# Patient Record
Sex: Male | Born: 1950 | Hispanic: Yes | Marital: Married | Smoking: Current some day smoker
Health system: Southern US, Community
[De-identification: ages and names within clinical notes are randomized; demographics above are authoritative.]

## PROBLEM LIST (undated history)

## (undated) DIAGNOSIS — I10 Essential (primary) hypertension: Secondary | ICD-10-CM

## (undated) DIAGNOSIS — E7800 Pure hypercholesterolemia, unspecified: Secondary | ICD-10-CM

## (undated) DIAGNOSIS — R7303 Prediabetes: Secondary | ICD-10-CM

---

## 2019-08-03 DIAGNOSIS — R9431 Abnormal electrocardiogram [ECG] [EKG]: Secondary | ICD-10-CM | POA: Insufficient documentation

## 2020-09-19 DIAGNOSIS — M19011 Primary osteoarthritis, right shoulder: Secondary | ICD-10-CM | POA: Insufficient documentation

## 2021-07-10 ENCOUNTER — Emergency Department: Payer: Medicare Other

## 2021-07-10 ENCOUNTER — Encounter: Payer: Self-pay | Admitting: Emergency Medicine

## 2021-07-10 ENCOUNTER — Other Ambulatory Visit: Payer: Self-pay

## 2021-07-10 ENCOUNTER — Emergency Department
Admission: EM | Admit: 2021-07-10 | Discharge: 2021-07-10 | Disposition: A | Discharge status: Home / Self Care | Payer: Medicare Other | Attending: Student in an Organized Health Care Education/Training Program | Admitting: Student in an Organized Health Care Education/Training Program

## 2021-07-10 ENCOUNTER — Ambulatory Visit
Admission: EM | Admit: 2021-07-10 | Discharge: 2021-07-10 | Disposition: A | Discharge status: Other Acute Inpatient Hospital | Payer: Medicare Other | Source: Home / Self Care

## 2021-07-10 DIAGNOSIS — Z79899 Other long term (current) drug therapy: Secondary | ICD-10-CM | POA: Insufficient documentation

## 2021-07-10 DIAGNOSIS — I10 Essential (primary) hypertension: Secondary | ICD-10-CM | POA: Insufficient documentation

## 2021-07-10 DIAGNOSIS — R42 Dizziness and giddiness: Secondary | ICD-10-CM | POA: Insufficient documentation

## 2021-07-10 DIAGNOSIS — E119 Type 2 diabetes mellitus without complications: Secondary | ICD-10-CM | POA: Insufficient documentation

## 2021-07-10 DIAGNOSIS — E782 Mixed hyperlipidemia: Secondary | ICD-10-CM | POA: Insufficient documentation

## 2021-07-10 DIAGNOSIS — H538 Other visual disturbances: Secondary | ICD-10-CM

## 2021-07-10 DIAGNOSIS — E1169 Type 2 diabetes mellitus with other specified complication: Secondary | ICD-10-CM | POA: Insufficient documentation

## 2021-07-10 DIAGNOSIS — E559 Vitamin D deficiency, unspecified: Secondary | ICD-10-CM

## 2021-07-10 DIAGNOSIS — Z0000 Encounter for general adult medical examination without abnormal findings: Secondary | ICD-10-CM | POA: Insufficient documentation

## 2021-07-10 HISTORY — DX: Prediabetes: R73.03

## 2021-07-10 HISTORY — DX: Pure hypercholesterolemia, unspecified: E78.00

## 2021-07-10 HISTORY — DX: Vitamin D deficiency, unspecified: E55.9

## 2021-07-10 HISTORY — DX: Essential (primary) hypertension: I10

## 2021-07-10 LAB — TROPONIN I (HIGH SENSITIVITY)
Troponin I (High Sensitivity): 5 ng/L (ref <18)
Troponin I (High Sensitivity): 5 ng/L (ref <18)

## 2021-07-10 LAB — COMPREHENSIVE METABOLIC PANEL
ALT: 29 U/L (ref 0–44)
AST: 27 U/L (ref 15–41)
Albumin: 4.3 g/dL (ref 3.5–5.0)
Alkaline Phosphatase: 60 U/L (ref 38–126)
Anion gap: 7 (ref 5–15)
BUN: 14 mg/dL (ref 8–23)
CO2: 26 mmol/L (ref 22–32)
Calcium: 9.5 mg/dL (ref 8.9–10.3)
Chloride: 102 mmol/L (ref 98–111)
Creatinine, Ser: 1.06 mg/dL (ref 0.61–1.24)
GFR, Estimated: >60 mL/min (ref >60)
Glucose, Bld: 149 mg/dL — ABNORMAL HIGH (ref 70–99)
Potassium: 4.7 mmol/L (ref 3.5–5.1)
Sodium: 135 mmol/L (ref 135–145)
Total Bilirubin: 1.1 mg/dL (ref 0.3–1.2)
Total Protein: 8.3 g/dL — ABNORMAL HIGH (ref 6.5–8.1)

## 2021-07-10 LAB — CBC
HCT: 46.9 % (ref 39.0–52.0)
Hemoglobin: 15.5 g/dL (ref 13.0–17.0)
MCH: 30.5 pg (ref 26.0–34.0)
MCHC: 33 g/dL (ref 30.0–36.0)
MCV: 92.1 fL (ref 80.0–100.0)
Platelets: 247 10*3/uL (ref 150–400)
RBC: 5.09 MIL/uL (ref 4.22–5.81)
RDW: 12.6 % (ref 11.5–15.5)
WBC: 7.5 10*3/uL (ref 4.0–10.5)
nRBC: 0 % (ref 0.0–0.2)

## 2021-07-10 LAB — GLUCOSE, CAPILLARY: Glucose-Capillary: 143 mg/dL — ABNORMAL HIGH (ref 70–99)

## 2021-07-10 MED ORDER — AMLODIPINE BESYLATE 5 MG PO TABS: 5.0000 mg | ORAL_TABLET | Freq: Every day | ORAL | 1 refills | Status: DC

## 2021-07-10 MED ORDER — AMLODIPINE BESYLATE 5 MG PO TABS
5.0000 mg | ORAL_TABLET | Freq: Once | ORAL | Status: AC
  Administered 2021-07-10: 5 mg via ORAL
  Filled 2021-07-10: qty 1

## 2021-07-10 NOTE — ED Triage Notes (Signed)
Pt in with co blurry vision and dizziness since last night. Denies any other symptoms or deficits, no hx of the same.  ?

## 2021-07-10 NOTE — ED Notes (Signed)
Several attempts to obtain EKG by myself and Arlys John, CMA. Equipment not functioning. Provider and nurse manager notified of problem. Biomed STAT ticket submitted.  ?

## 2021-07-10 NOTE — ED Notes (Signed)
Patient is being discharged from the Urgent Care and sent to the Emergency Department via POV . Per White, NP, patient is in need of higher level of care due to elevated BP. Patient is aware and verbalizes understanding of plan of care.  ?Vitals:  ? 07/10/21 0832  ?BP: (!) 175/84  ?Pulse: (!) 57  ?Resp: 18  ?Temp: 97.9 ?F (36.6 ?C)  ?SpO2: 98%  ?  ?

## 2021-07-10 NOTE — ED Provider Notes (Signed)
?[facility] ? ? ? ?CSN: [id number] ?Arrival date & time: 07/10/21  2119 ? ? ?  ? ?History   ?Chief Complaint ?Chief Complaint  ?Patient presents with  ? Blurred Vision  ? Dizziness  ? ? ?HPI ?Scott Compton is a 71 y.o. male.  ? ?Patient presents with constant lightheadedness, sensation of feeling off balance and intermittent blurred vision beginning last night.  Check blood sugar at home was 137 blood pressure 1 month ago in office was 122/80 with his PCP.  Endorses that he takes daily lisinopril 40 mg.  History of hypertension, high cholesterol and diabetes type 2.  Denies chest pain, shortness of breath, numbness or weakness, memory or speech changes.  Accompanied by family member. ? ?Past Medical History:  ?Diagnosis Date  ? High cholesterol   ? Hypertension   ? Pre-diabetes   ? ? ?Patient Active Problem List  ? Diagnosis Date Noted  ? Vitamin D deficiency 07/10/2021  ? Type 2 diabetes mellitus with hyperlipidemia (HCC) 07/10/2021  ? Routine history and physical examination of adult 07/10/2021  ? Mixed hyperlipidemia 07/10/2021  ? Essential hypertension 07/10/2021  ? Hypertension 07/10/2021  ? Arthritis of right shoulder region 09/19/2020  ? Abnormal EKG 08/03/2019  ? ? ?History reviewed. No pertinent surgical history. ? ? ? ? ?Home Medications   ? ?Prior to Admission medications   ?Medication Sig Start Date End Date Taking? Authorizing Provider  ?amLODipine (NORVASC) 5 MG tablet Take by mouth. 06/03/21 09/01/21 Yes [provider]  ?atorvastatin (LIPITOR) 40 MG tablet Take by mouth. 06/03/21 09/01/21 Yes [provider]  ?ergocalciferol (VITAMIN D2) 1.25 MG (50000 UT) capsule Take by mouth. 08/26/20  Yes [provider]  ?hydrochlorothiazide (HYDRODIURIL) 25 MG tablet Take by mouth. 09/19/20  Yes [provider]  ?lisinopril (ZESTRIL) 40 MG tablet Take by mouth. 06/25/21 09/23/21 Yes [provider]  ?metFORMIN (GLUCOPHAGE) 500 MG tablet Take by mouth. 06/03/21 09/01/21  Yes [provider]  ? ? ?Family History ?History reviewed. No pertinent family history. ? ?Social History ?Social History  ? ?Tobacco Use  ? Smoking status: Some Days  ?  Types: Cigars  ? Smokeless tobacco: Never  ?Vaping Use  ? Vaping Use: Never used  ?Substance Use Topics  ? Alcohol use: Yes  ?  Comment: social drinker  ? Drug use: Never  ? ? ? ?Allergies   ?Patient has no known allergies. ? ? ?Review of Systems ?Review of Systems  ?Neurological:  Positive for dizziness.  ? ? ?Physical Exam ?Triage Vital Signs ?ED Triage Vitals  ?Enc Vitals Group  ?   BP 07/10/21 0832 (!) 175/84  ?   Pulse Rate 07/10/21 0832 (!) 57  ?   Resp 07/10/21 0832 18  ?   Temp 07/10/21 0832 97.9 ?F (36.6 ?C)  ?   Temp Source 07/10/21 0832 Oral  ?   SpO2 07/10/21 0832 98 %  ?   Weight --   ?   Height --   ?   Head Circumference --   ?   Peak Flow --   ?   Pain Score 07/10/21 0834 0  ?   Pain Loc --   ?   Pain Edu? --   ?   Excl. in GC? --   ? ?No data found. ? ?Updated Vital Signs ?BP (!) 175/84 (BP Location: Left Arm)   Pulse (!) 57   Temp 97.9 ?F (36.6 ?C) (Oral)   Resp 18   SpO2  98%  ? ?Visual Acuity ?Right Eye Distance:   ?Left Eye Distance:   ?Bilateral Distance:   ? ?Right Eye Near:   ?Left Eye Near:    ?Bilateral Near:    ? ?Physical Exam ? ? ?UC Treatments / Results  ?Labs ?(all labs ordered are listed, but only abnormal results are displayed) ?Labs Reviewed  ?GLUCOSE, CAPILLARY - Abnormal; Notable for the following components:  ?    Result Value  ? Glucose-Capillary 143 (*)   ? All other components within normal limits  ?CBG MONITORING, ED  ? ? ?EKG ? ? ?Radiology ?No results found. ? ?Procedures ?Procedures (including critical care time) ? ?Medications Ordered in UC ?Medications - No data to display ? ?Initial Impression / Assessment and Plan / UC Course  ?I have reviewed the triage vital signs and the nursing notes. ? ?Pertinent labs & imaging results that were available during my care of the patient were reviewed  by me and considered in my medical decision making (see chart for details). ? ?Dizziness ?Lightheadedness ?Blurred vision ? ?Blood pressure elevated in triage at 175/84, per chart review and PCP note from 06/17/2021 patient to be taking amlodipine, confirmed once again in the PCP note from December 2022 however patient endorses only taking lisinopril, unsure which medication is accurate for blood pressure management, unable to obtain EKG in office due to malfunctioning of equipment, discussed with patient, sent to the nearest emergency department for evaluation of hypertensive urgency and further cardiac involvement ?Final Clinical Impressions(s) / UC Diagnoses  ? ?Final diagnoses:  ?Dizziness  ?Lightheadedness  ?Blurred vision  ? ?Discharge Instructions   ?None ?  ? ?ED Prescriptions   ?None ?  ? ?PDMP not reviewed this encounter. ?  ?Valinda Hoar, NP ?07/10/21 7672 ? ?

## 2021-07-10 NOTE — ED Provider Notes (Signed)
? ?[facility] ?Provider Note ? ? ? Event Date/Time  ? First MD Initiated Contact with Patient 07/10/21 1024   ?  (approximate) ? ? ?History  ? ?Blurred Vision ? ? ?HPI ? ?Scott Compton is a 71 y.o. male history of type 2 diabetes as well as hypertension presents to the ER for evaluation of dizziness and blurred vision.  States he woke up with the symptoms.  Was feeling a little uneasy on unsteady last night when he went to bed.  States that this morning when he got out of bed was feeling dizzy lightheaded like he could not find his balance and was also seeing double vision when he first got up.  When he sat down and symptoms were feeling better denies any numbness or tingling no headache.  Denies any chest pain or pressure.  States that symptoms have improved. ?  ? ? ?Physical Exam  ? ?Triage Vital Signs: ?ED Triage Vitals  ?Enc Vitals Group  ?   BP 07/10/21 0942 (!) 183/95  ?   Pulse Rate 07/10/21 0942 (!) 57  ?   Resp 07/10/21 0942 20  ?   Temp 07/10/21 0942 97.7 ?F (36.5 ?C)  ?   Temp Source 07/10/21 0942 Oral  ?   SpO2 07/10/21 0942 98 %  ?   Weight 07/10/21 0941 190 lb (86.2 kg)  ?   Height 07/10/21 0941 5\' 10"  (1.778 m)  ?   Head Circumference --   ?   Peak Flow --   ?   Pain Score 07/10/21 0941 0  ?   Pain Loc --   ?   Pain Edu? --   ?   Excl. in GC? --   ? ? ?Most recent vital signs: ?Vitals:  ? 07/10/21 0942 07/10/21 1306  ?BP: (!) 183/95 (!) 178/90  ?Pulse: (!) 57 60  ?Resp: 20 20  ?Temp: 97.7 ?F (36.5 ?C)   ?SpO2: 98% 98%  ? ? ? ?Constitutional: Alert  ?Eyes: Conjunctivae are normal.  ?Head: Atraumatic. ?Nose: No congestion/rhinnorhea. ?Mouth/Throat: Mucous membranes are moist.   ?Neck: Painless ROM.  ?Cardiovascular:   Good peripheral circulation. ?Respiratory: Normal respiratory effort.  No retractions.  ?Gastrointestinal: Soft and nontender.  ?Musculoskeletal:  no deformity ?Neurologic:  CN- intact.  No facial droop, Normal FNF.  Normal heel to shin.  Sensation intact  bilaterally. Normal speech and language. No gross focal neurologic deficits are appreciated. No gait instability. ?Skin:  Skin is warm, dry and intact. No rash noted. ?Psychiatric: Mood and affect are normal. Speech and behavior are normal. ? ? ? ?ED Results / Procedures / Treatments  ? ?Labs ?(all labs ordered are listed, but only abnormal results are displayed) ?Labs Reviewed  ?COMPREHENSIVE METABOLIC PANEL - Abnormal; Notable for the following components:  ?    Result Value  ? Glucose, Bld 149 (*)   ? Total Protein 8.3 (*)   ? All other components within normal limits  ?CBC  ?TROPONIN I (HIGH SENSITIVITY)  ?TROPONIN I (HIGH SENSITIVITY)  ? ? ? ?EKG ? ?ED ECG REPORT ?I, 09/09/21, the attending physician, personally viewed and interpreted this ECG. ? ? Date: 07/10/2021 ? EKG Time: 9:46 ? Rate: 55 ? Rhythm: sinus ? Axis: left ? Intervals:normal ? ST&T Change: no stemi, no depression ? ? ? ?RADIOLOGY ?Please see ED Course for my review and interpretation. ? ?I personally reviewed all radiographic images ordered to evaluate for the above acute complaints and reviewed radiology reports  and findings.  These findings were personally discussed with the patient.  Please see medical record for radiology report. ? ? ? ?PROCEDURES: ? ?Critical Care performed:  ? ?Procedures ? ? ?MEDICATIONS ORDERED IN ED: ?Medications  ?amLODipine (NORVASC) tablet 5 mg (5 mg Oral Given 07/10/21 1203)  ? ? ? ?IMPRESSION / MDM / ASSESSMENT AND PLAN / ED COURSE  ?I reviewed the triage vital signs and the nursing notes. ?             ?               ? ?Differential diagnosis includes, but is not limited to, hypertensive urgency, essential hypertension, CVA, TIA, electrolyte abnormality, anemia, ACS, dehydration, vertigo ? ?Patient presented to the ER with the symptoms as described above clinically very well-appearing.  Possible TIA though seems very positional in nature possible dehydration.  Possible vertigo doubt dysrhythmia denies any  chest pain or pressure given his hypertension we will order cardiac enzymes EKG nonischemic.  No signs or symptoms of CHF. ? ? ?Clinical Course as of 07/10/21 1352  ?Wed Jul 10, 2021  ?1219 Patient up ambulating with steady gait.  Repeat exam remains nonfocal and reassuring.  He is asymptomatic remains well-appearing.  MRI without findings suggest acute stroke or CNS lesion.  Will observe for repeat troponin but if within normal limits to anticipate will be appropriate for outpatient follow-up. [PR]  ?1349 Patient reassessed remains well-appearing in no acute distress.  Repeat troponin negative.  At this point I do believe he stable appropriate for outpatient follow-up.  Patient agreeable plan. [PR]  ?  ?Clinical Course User Index ?[PR] Willy Eddy, MD  ? ? ? ?FINAL CLINICAL IMPRESSION(S) / ED DIAGNOSES  ? ?Final diagnoses:  ?Dizziness  ?Hypertension, unspecified type  ? ? ? ?Rx / DC Orders  ? ?ED Discharge Orders   ? ?      Ordered  ?  amLODipine (NORVASC) 5 MG tablet  Daily       ? 07/10/21 1349  ? ?  ?  ? ?  ? ? ? ?Note:  This document was prepared using Dragon voice recognition software and may include unintentional dictation errors. ? ?  ?Willy Eddy, MD ?07/10/21 1352 ? ?

## 2021-07-10 NOTE — ED Triage Notes (Signed)
Patient presents to Urgent Care with complaints of light headedness and blurred vision since last night. Pt states he is prediabetic and has a hx of HTN. Last BG this morning 137. He states last BP a month ago 122/80.  ? ?Denies chest pain, SOB, numbness or weakness to extremities, no changes in speech.  ?

## 2023-01-16 DIAGNOSIS — I700 Atherosclerosis of aorta: Secondary | ICD-10-CM | POA: Insufficient documentation

## 2023-03-16 LAB — HM DIABETES EYE EXAM

## 2023-03-23 IMAGING — CT CT HEAD W/O CM
4 series · 17 of 47 positions shown, 19 images · non-contrast
Comparison: None.

CLINICAL DATA: Dizziness, persistent/recurrent, cardiac or vascular
cause suspected



[Series 2: head wo · axial · 0.39mm/px · z∈[-86,+24]mm · 7 of 30 slices shown, 9 images]
[im 4/30  brain]
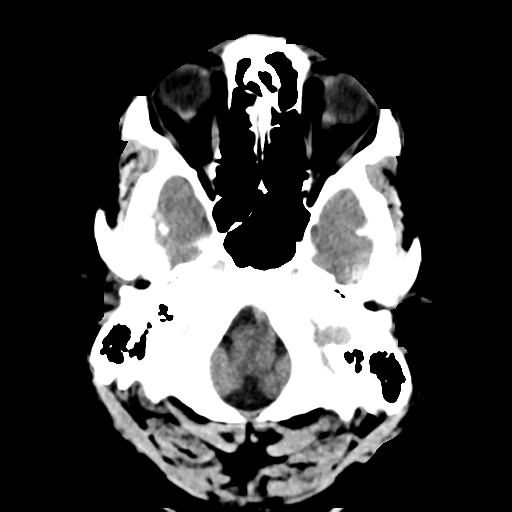
[im 4/30  bone]
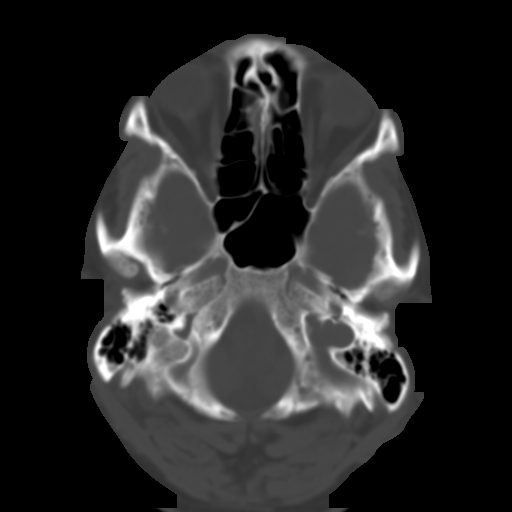
[im 8/30  brain]
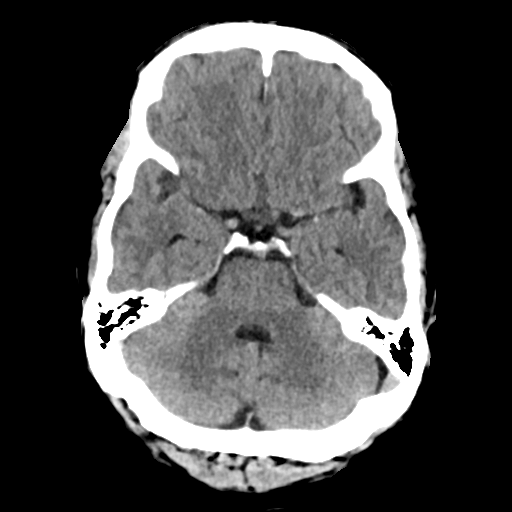
[im 11/30  brain]
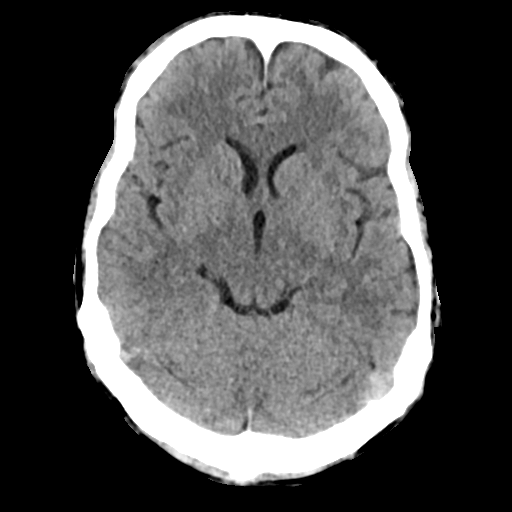
[im 15/30  brain]
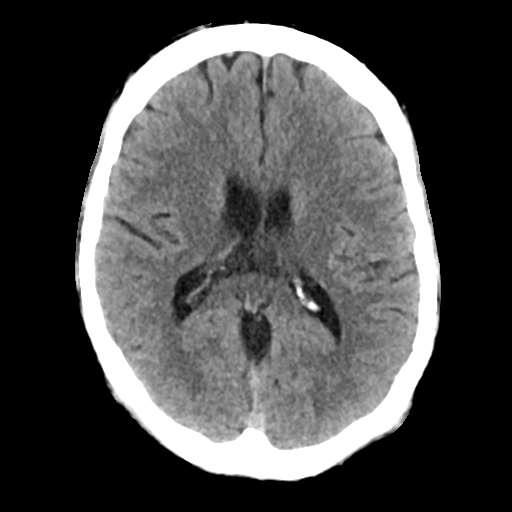
[im 19/30  brain]
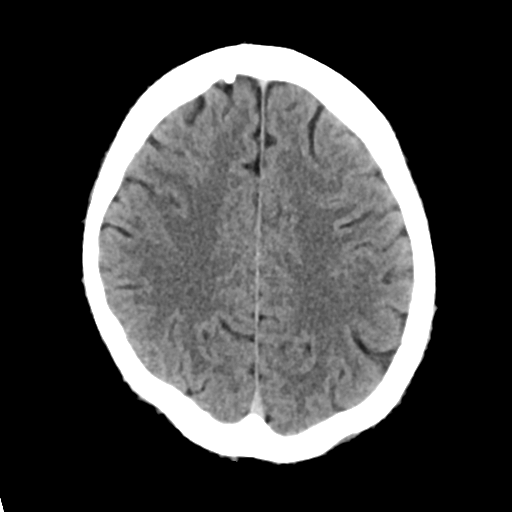
[im 19/30  bone]
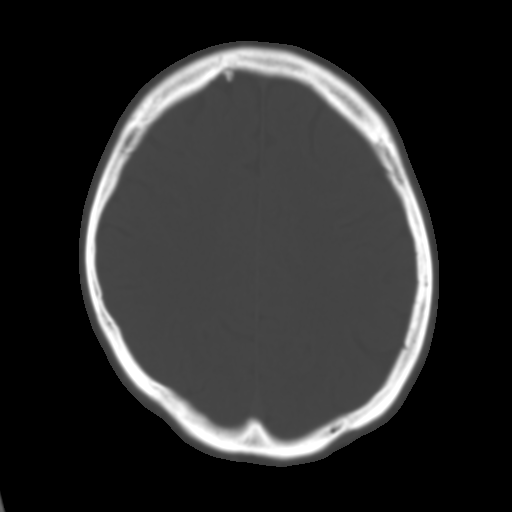
[im 22/30  brain]
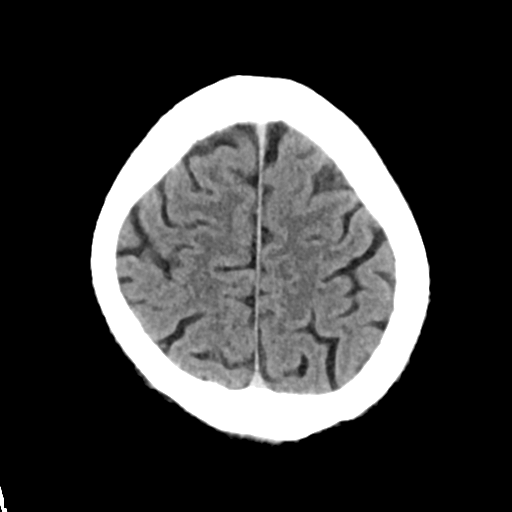
[im 26/30  brain]
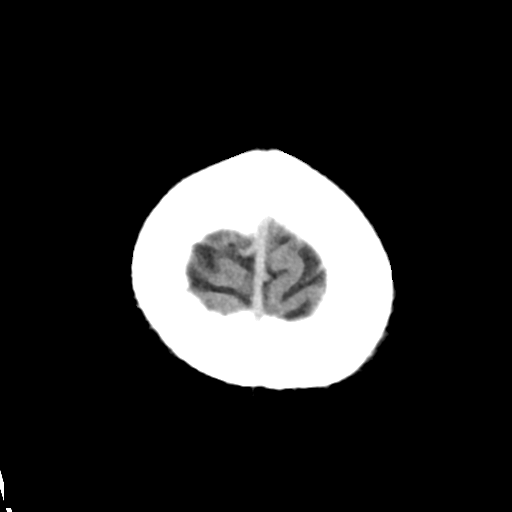

[Series 3: head bone · axial · 0.39mm/px · z∈[-86,-34]mm · 4 of 75 slices shown]
[im 8/75  bone]
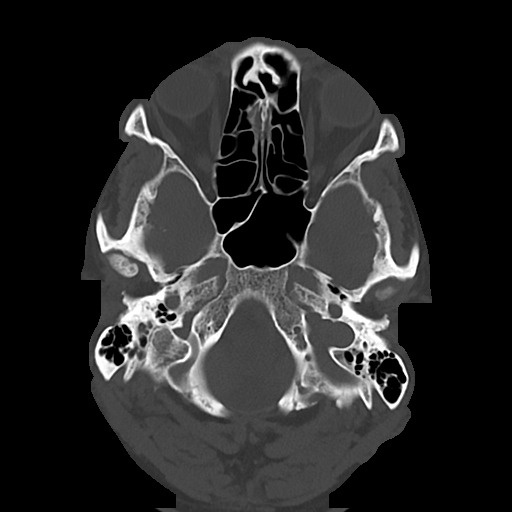
[im 15/75  bone]
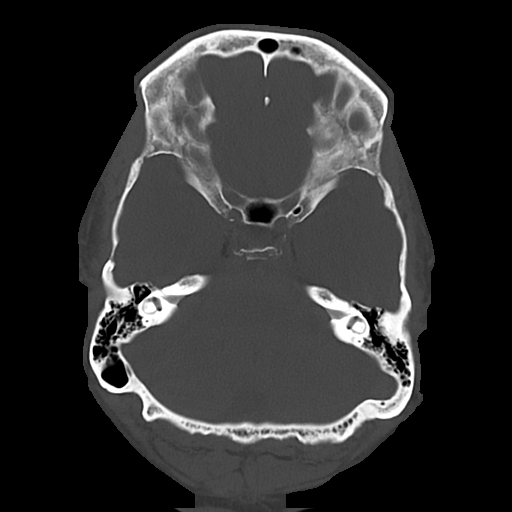
[im 23/75  bone]
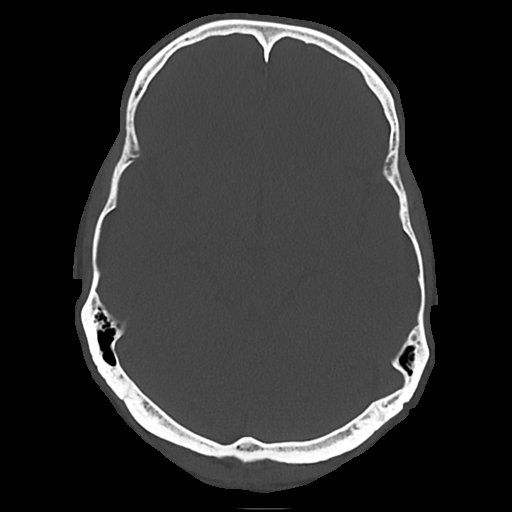
[im 34/75  bone]
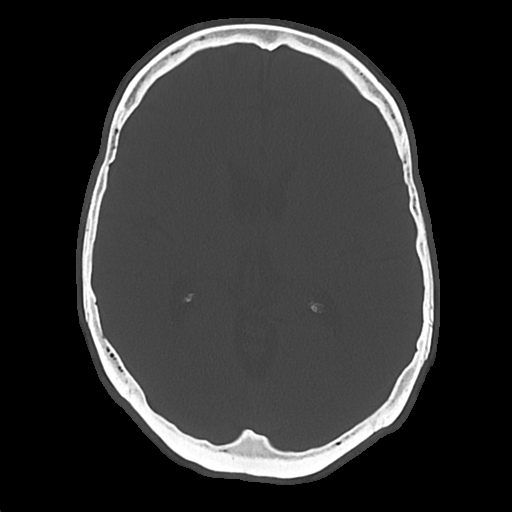

[Series 4: cor soft · coronal · 0.31mm/px · 3 of 65 slices shown]
[im 22/65  brain]
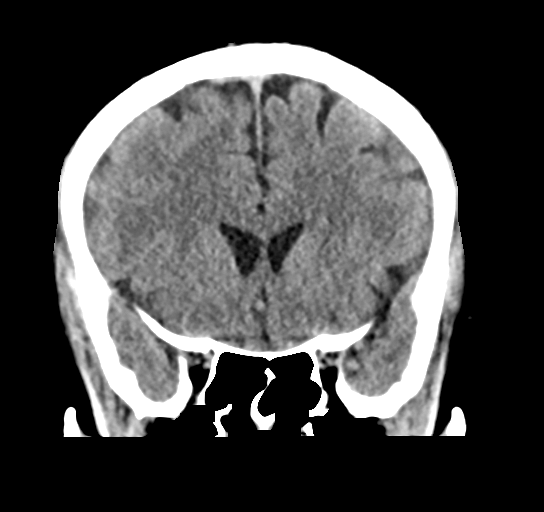
[im 29/65  brain]
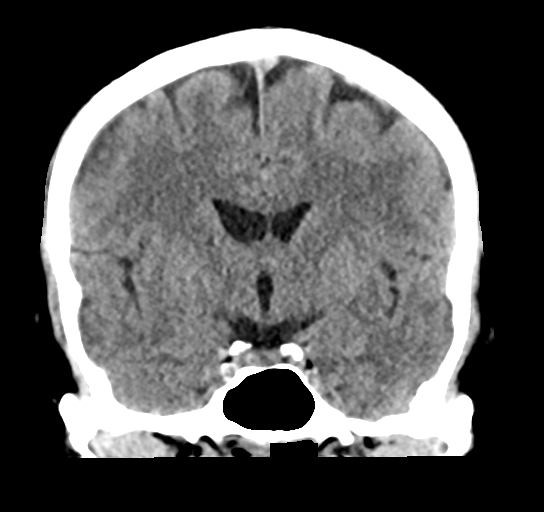
[im 36/65  brain]
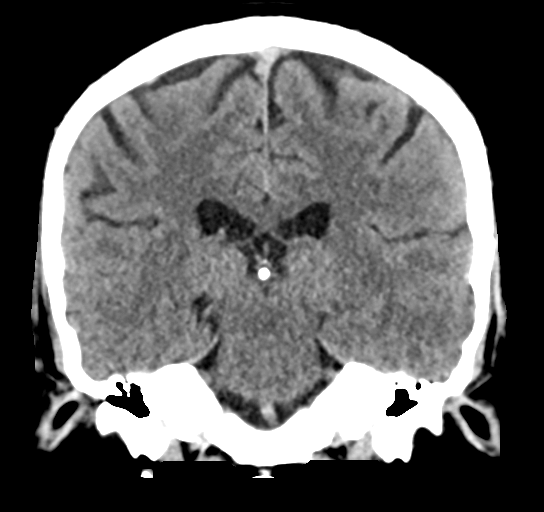

[Series 5: sag soft · sagittal · 0.31mm/px · 3 of 56 slices shown]
[im 19/56  brain]
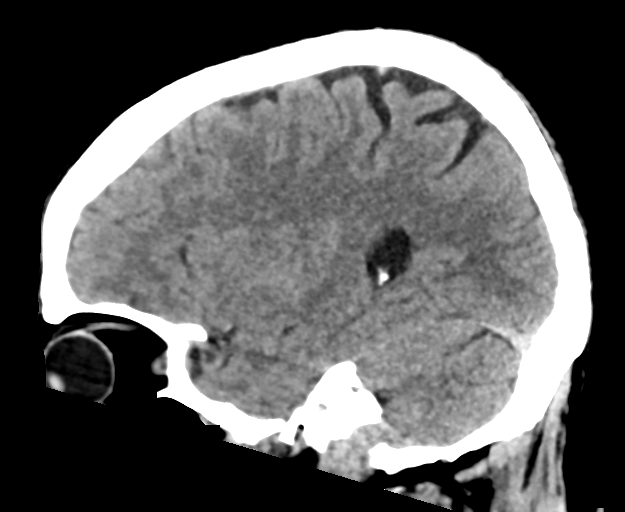
[im 28/56  brain]
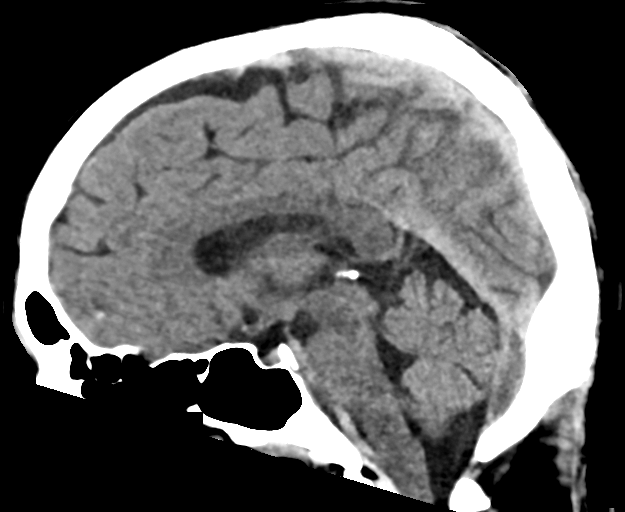
[im 37/56  brain]
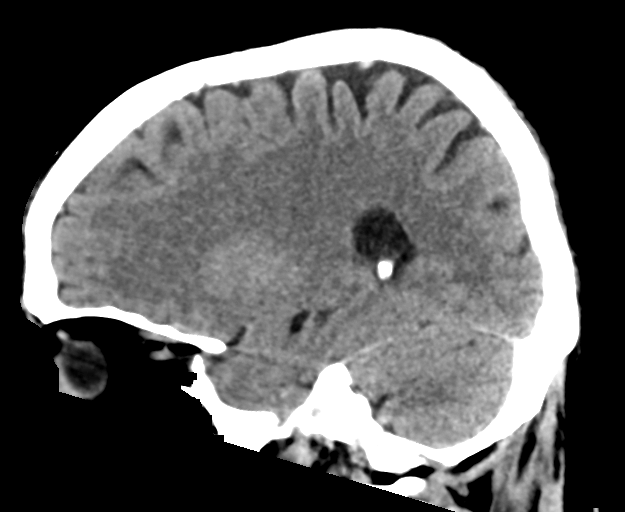

[17 of 47 positions shown; findings below may reference images not displayed]

FINDINGS: Brain: There is no acute intracranial hemorrhage, mass effect, or
edema. Gray-white differentiation is preserved. There is no
extra-axial fluid collection. Ventricles and sulci are within normal
limits in size and configuration.

Vascular: There is atherosclerotic calcification at the skull base.

Skull: Calvarium is unremarkable.

Sinuses/Orbits: No acute finding.

Other: None.
IMPRESSION: No acute intracranial abnormality.

## 2023-04-09 ENCOUNTER — Ambulatory Visit (INDEPENDENT_AMBULATORY_CARE_PROVIDER_SITE_OTHER): Payer: Medicare Other | Admitting: Pediatrics

## 2023-04-09 ENCOUNTER — Encounter: Payer: Self-pay | Admitting: Pediatrics

## 2023-04-09 VITALS — BP 137/70 | HR 53 | Temp 98.3°F | Ht 69.5 in | Wt 181.8 lb

## 2023-04-09 DIAGNOSIS — E1169 Type 2 diabetes mellitus with other specified complication: Secondary | ICD-10-CM

## 2023-04-09 DIAGNOSIS — I10 Essential (primary) hypertension: Secondary | ICD-10-CM

## 2023-04-09 DIAGNOSIS — E785 Hyperlipidemia, unspecified: Secondary | ICD-10-CM | POA: Diagnosis not present

## 2023-04-09 DIAGNOSIS — Z7689 Persons encountering health services in other specified circumstances: Secondary | ICD-10-CM

## 2023-04-09 DIAGNOSIS — Z1159 Encounter for screening for other viral diseases: Secondary | ICD-10-CM

## 2023-04-09 DIAGNOSIS — Z1330 Encounter for screening examination for mental health and behavioral disorders, unspecified: Secondary | ICD-10-CM

## 2023-04-09 DIAGNOSIS — Z1211 Encounter for screening for malignant neoplasm of colon: Secondary | ICD-10-CM

## 2023-04-09 DIAGNOSIS — R001 Bradycardia, unspecified: Secondary | ICD-10-CM | POA: Diagnosis not present

## 2023-04-09 LAB — CBC
Hematocrit: 43.1 % (ref 37.5–51.0)
Hemoglobin: 14.6 g/dL (ref 13.0–17.7)
MCH: 30 pg (ref 26.6–33.0)
MCHC: 33.9 g/dL (ref 31.5–35.7)
MCV: 89 fL (ref 79–97)
Platelets: 225 10*3/uL (ref 150–450)
RBC: 4.87 x10E6/uL (ref 4.14–5.80)
RDW: 13.7 % (ref 11.6–15.4)
WBC: 7.5 10*3/uL (ref 3.4–10.8)

## 2023-04-09 LAB — MICROALBUMIN, URINE WAIVED
Creatinine, Urine Waived: 200 mg/dL (ref 10–300)
Microalb, Ur Waived: 80 mg/L — ABNORMAL HIGH (ref 0–19)
Microalb/Creat Ratio: <30 mg/g (ref <30)

## 2023-04-09 MED ORDER — AMLODIPINE BESYLATE 5 MG PO TABS: 5.0000 mg | ORAL_TABLET | Freq: Every day | ORAL | 3 refills | Status: DC

## 2023-04-09 MED ORDER — LISINOPRIL 40 MG PO TABS: 40.0000 mg | ORAL_TABLET | Freq: Every day | ORAL | 3 refills | Status: DC

## 2023-04-09 MED ORDER — ATORVASTATIN CALCIUM 40 MG PO TABS: 40.0000 mg | ORAL_TABLET | Freq: Every day | ORAL | 3 refills | Status: DC

## 2023-04-09 NOTE — Patient Instructions (Signed)
 Bienvenido a [employer]!  Como su mdico de atencin primaria, espero trabajar con usted para ayudarle a barista sus objetivos de [place].  Tenga en cuenta un par de elementos logsticos: - Si me enva un mensaje a clinical cytogeneticist, es posible que tarde [place] 1 y 2 das hbiles en responderle. Esto es para mensajes no urgentes.  - Si necesita atencin clnica urgente, llame a la clnica o presntese en la sala de urgencias/emergencias. - Si tiene laboratorios, recruitment consultant un mensaje sobre ellos en 1 o 2 das hbiles. - No estoy aqu los lunes; de lo contrario, estar disponible de martes a viernes de 8 a.m. a 5 p.m.  Good to meet you! Welcome to [facility]!  As your primary care doctor, I look forward to working with you to help you reach your health goals.  Please be aware of a couple of logistical items: - If you message me on mychart, it may take me 1-2 business days to get back to you. This is for non-urgent messaging.  - If you require urgent clinical attention, please call the clinic or present to urgent care/emergency room - If you have labs, I typically will send a message about them in 1-2 business days. - I am not here on Mondays, otherwise will be available from Tuesday-Friday during 8a-5pm.

## 2023-04-09 NOTE — Assessment & Plan Note (Signed)
 Well controlled on amlodipine and lisinopril. Sent refills below.

## 2023-04-09 NOTE — Assessment & Plan Note (Signed)
 Last A1c 7.4 in 2022. He notes he was always told he had DM. Plan to repeat and re-evaluate today. Eye exam done at [facility]. Will do foot exam with physical. Continue statin. Other labs as below.

## 2023-04-09 NOTE — Assessment & Plan Note (Signed)
 Patient notes this is chronic. He is asymptomatic today. Continue to monitor. Consider EKG at follow up visit.

## 2023-04-09 NOTE — Progress Notes (Signed)
 Establish Care Note  BP 137/70   Pulse (!) 53   Temp 98.3 F (36.8 C) (Oral)   Ht 5' 9.5 (1.765 m)   Wt 181 lb 12.8 oz (82.5 kg)   SpO2 97%   BMI 26.46 kg/m    Subjective:    Patient ID: Scott Compton, male    DOB: 25-Aug-1950, 73 y.o.   MRN: [id number]  HPI: Scott Compton is a 73 y.o. male  Chief Complaint  Patient presents with   Diabetes   Hyperlipidemia   Hypertension    Establishing care, the following was discussed today:  Discussed the use of AI scribe software for clinical note transcription with the patient, who gave verbal consent to proceed.  History of Present Illness   The patient, a 73 year old with a history of pre-diabetes, hypertension, and hyperlipidemia, has recently relocated to [place] . Scott Compton has been managing his pre-diabetes with lifestyle modifications and regular self-monitoring of blood glucose levels, which have been consistently around 103-104 mg/dL. Scott Compton denies taking any medications for diabetes.  Scott Compton has been receiving care at [facility] but has transitioned to the current clinic for convenience. Scott Compton reports no new symptoms or health concerns since his last visit. Scott Compton has been adhering to his prescribed medications, including Lisinopril  and Amlodipine , for hypertension management.  The patient is a former smoker and admits to smoking one cigarette per week for relaxation, as Scott Compton describes himself as 'hyper' and always needing to be busy. Scott Compton is currently retired but keeps himself occupied with household chores and outdoor activities.      #HM Will review HM records and updated as needed.  Relevant past medical, surgical, family and social history reviewed and updated as indicated. Interim medical history since our last visit reviewed. Allergies and medications reviewed and updated.  ROS per HPI unless specifically indicated above     Objective:    BP 137/70   Pulse (!) 53   Temp 98.3 F (36.8 C) (Oral)   Ht 5' 9.5 (1.765 m)   Wt 181 lb  12.8 oz (82.5 kg)   SpO2 97%   BMI 26.46 kg/m   Wt Readings from Last 3 Encounters:  04/09/23 181 lb 12.8 oz (82.5 kg)  07/10/21 190 lb (86.2 kg)     Physical Exam Constitutional:      Appearance: Normal appearance.  HENT:     Head: Normocephalic and atraumatic.  Eyes:     Pupils: Pupils are equal, round, and reactive to light.  Cardiovascular:     Rate and Rhythm: Normal rate and regular rhythm.     Pulses: Normal pulses.     Heart sounds: Normal heart sounds.  Pulmonary:     Effort: Pulmonary effort is normal.     Breath sounds: Normal breath sounds.  Abdominal:     General: Abdomen is flat.     Palpations: Abdomen is soft.  Musculoskeletal:        General: Normal range of motion.     Cervical back: Normal range of motion.  Skin:    General: Skin is warm and dry.     Capillary Refill: Capillary refill takes less than 2 seconds.  Neurological:     General: No focal deficit present.     Mental Status: Scott Compton is alert. Mental status is at baseline.  Psychiatric:        Mood and Affect: Mood normal.        Behavior: Behavior normal.       04/09/2023  9:05 AM  Depression screen PHQ 2/9  Decreased Interest 0  Down, Depressed, Hopeless 0  PHQ - 2 Score 0  Altered sleeping 0  Tired, decreased energy 0  Change in appetite 0  Feeling bad or failure about yourself  0  Trouble concentrating 0  Moving slowly or fidgety/restless 0  Suicidal thoughts 0  PHQ-9 Score 0  Difficult doing work/chores Not difficult at all        04/09/2023    9:06 AM  GAD 7 : Generalized Anxiety Score  Nervous, Anxious, on Edge 0  Control/stop worrying 0  Worry too much - different things 0  Trouble relaxing 0  Restless 0  Easily annoyed or irritable 0  Afraid - awful might happen 0  Total GAD 7 Score 0  Anxiety Difficulty Not difficult at all       Assessment & Plan:  Assessment & Plan   Type 2 diabetes mellitus with hyperlipidemia (HCC) Assessment & Plan: Last A1c 7.4 in 2022.  Scott Compton notes Scott Compton was always told Scott Compton had DM. Plan to repeat and re-evaluate today. Eye exam done at [facility]. Will do foot exam with physical. Continue statin. Other labs as below.  Orders: -     Microalbumin, Urine Waived -     Atorvastatin  Calcium ; Take 1 tablet (40 mg total) by mouth daily.  Dispense: 90 tablet; Refill: 3 -     CBC -     Comprehensive metabolic panel -     Hemoglobin A1c -     Lipid panel  Primary hypertension Assessment & Plan: Well controlled on amlodipine  and lisinopril . Sent refills below.  Orders: -     amLODIPine  Besylate; Take 1 tablet (5 mg total) by mouth daily.  Dispense: 90 tablet; Refill: 3 -     Lisinopril ; Take 1 tablet (40 mg total) by mouth daily.  Dispense: 90 tablet; Refill: 3  Heart rate slow Assessment & Plan: Patient notes this is chronic. Scott Compton is asymptomatic today. Continue to monitor. Consider EKG at follow up visit.  Encounter to establish care Reviewed patient record including history, medications, problem list. HM updated as able. Will bring records and will fill HM gaps as needed at follow up visit.  Encounter for behavioral health screening As part of their intake evaluation, the patient was screened for depression, anxiety.  PHQ9 SCORE 0, GAD7 SCORE 0. Screening results negative for tested conditions. Continue to monitor.  Screening for colon cancer -     Ambulatory referral to Gastroenterology  Encounter for hepatitis C screening test for low risk patient -     Hepatitis C antibody  Follow up plan: Return in about 7 months (around 11/07/2023) for Physical.  Kiylah Loyer P Theora Vankirk, MD   Approximately 45 minutes spent on patient encounter today including assessment, counseling, diagnosing, treatment plan development, and charting.

## 2023-04-10 ENCOUNTER — Other Ambulatory Visit: Payer: Self-pay | Admitting: Pediatrics

## 2023-04-10 DIAGNOSIS — E1169 Type 2 diabetes mellitus with other specified complication: Secondary | ICD-10-CM

## 2023-04-10 LAB — COMPREHENSIVE METABOLIC PANEL
ALT: 19 [IU]/L (ref 0–44)
AST: 20 [IU]/L (ref 0–40)
Albumin: 4.3 g/dL (ref 3.9–4.9)
Alkaline Phosphatase: 72 [IU]/L (ref 44–121)
BUN/Creatinine Ratio: 15 (ref 10–24)
BUN: 14 mg/dL (ref 8–27)
Bilirubin Total: 0.4 mg/dL (ref 0.0–1.2)
CO2: 24 mmol/L (ref 20–29)
Calcium: 9.2 mg/dL (ref 8.6–10.2)
Chloride: 101 mmol/L (ref 96–106)
Creatinine, Ser: 0.96 mg/dL (ref 0.76–1.27)
Globulin, Total: 3 g/dL (ref 1.5–4.5)
Glucose: 90 mg/dL (ref 70–99)
Potassium: 4.4 mmol/L (ref 3.5–5.2)
Sodium: 137 mmol/L (ref 134–144)
Total Protein: 7.3 g/dL (ref 6.0–8.5)
eGFR: 85 mL/min/{1.73_m2} (ref >59)

## 2023-04-10 LAB — LIPID PANEL
Chol/HDL Ratio: 5.9 {ratio} — ABNORMAL HIGH (ref 0.0–5.0)
Cholesterol, Total: 225 mg/dL — ABNORMAL HIGH (ref 100–199)
HDL: 38 mg/dL — ABNORMAL LOW (ref >39)
LDL Chol Calc (NIH): 152 mg/dL — ABNORMAL HIGH (ref 0–99)
Triglycerides: 193 mg/dL — ABNORMAL HIGH (ref 0–149)
VLDL Cholesterol Cal: 35 mg/dL (ref 5–40)

## 2023-04-10 LAB — HEMOGLOBIN A1C
Est. average glucose Bld gHb Est-mCnc: 137 mg/dL
Hgb A1c MFr Bld: 6.4 % — ABNORMAL HIGH (ref 4.8–5.6)

## 2023-04-10 LAB — HEPATITIS C ANTIBODY: Hep C Virus Ab: NONREACTIVE

## 2023-04-10 MED ORDER — ATORVASTATIN CALCIUM 80 MG PO TABS: 80.0000 mg | ORAL_TABLET | Freq: Every day | ORAL | 3 refills | Status: DC

## 2023-04-14 ENCOUNTER — Encounter: Payer: Self-pay | Admitting: Pediatrics

## 2023-05-18 ENCOUNTER — Other Ambulatory Visit: Payer: Medicare Other

## 2023-05-18 DIAGNOSIS — E1169 Type 2 diabetes mellitus with other specified complication: Secondary | ICD-10-CM

## 2023-05-19 LAB — LIPID PANEL
Chol/HDL Ratio: 6.3 {ratio} — ABNORMAL HIGH (ref 0.0–5.0)
Cholesterol, Total: 220 mg/dL — ABNORMAL HIGH (ref 100–199)
HDL: 35 mg/dL — ABNORMAL LOW (ref >39)
LDL Chol Calc (NIH): 144 mg/dL — ABNORMAL HIGH (ref 0–99)
Triglycerides: 226 mg/dL — ABNORMAL HIGH (ref 0–149)
VLDL Cholesterol Cal: 41 mg/dL — ABNORMAL HIGH (ref 5–40)

## 2023-05-27 ENCOUNTER — Other Ambulatory Visit: Payer: Self-pay | Admitting: Pediatrics

## 2023-05-27 DIAGNOSIS — E782 Mixed hyperlipidemia: Secondary | ICD-10-CM

## 2023-05-27 MED ORDER — EZETIMIBE 10 MG PO TABS: 10.0000 mg | ORAL_TABLET | Freq: Every day | ORAL | 3 refills | Status: DC

## 2023-05-27 NOTE — Progress Notes (Signed)
 Adding zetia to help w cholesterol  The 10-year ASCVD risk score (Arnett DK, et al., 2019) is: 54.4%   Values used to calculate the score:     Age: 73 years     Sex: Male     Is Non-Hispanic African American: No     Diabetic: Yes     Tobacco smoker: Yes     Systolic Blood Pressure: 137 mmHg     Is BP treated: Yes     HDL Cholesterol: 35 mg/dL     Total Cholesterol: 220 mg/dL   Jackolyn Confer, MD

## 2023-06-02 NOTE — Progress Notes (Signed)
 Discussed with patient. He started the medication the day before yesterday. At this time has no questions.

## 2023-11-11 ENCOUNTER — Encounter: Payer: Self-pay | Admitting: Pediatrics

## 2023-11-11 ENCOUNTER — Ambulatory Visit: Payer: Self-pay | Admitting: Pediatrics

## 2023-11-11 VITALS — BP 128/78 | HR 58 | Temp 97.0°F | Ht 69.0 in | Wt 174.0 lb

## 2023-11-11 DIAGNOSIS — I10 Essential (primary) hypertension: Secondary | ICD-10-CM | POA: Diagnosis not present

## 2023-11-11 DIAGNOSIS — Z1211 Encounter for screening for malignant neoplasm of colon: Secondary | ICD-10-CM | POA: Diagnosis not present

## 2023-11-11 DIAGNOSIS — E1169 Type 2 diabetes mellitus with other specified complication: Secondary | ICD-10-CM

## 2023-11-11 DIAGNOSIS — E785 Hyperlipidemia, unspecified: Secondary | ICD-10-CM

## 2023-11-11 DIAGNOSIS — Z0000 Encounter for general adult medical examination without abnormal findings: Secondary | ICD-10-CM | POA: Diagnosis not present

## 2023-11-11 NOTE — Progress Notes (Signed)
 Subjective:   Scott Compton is a 73 y.o. male who presents for Medicare Annual/Subsequent preventive examination.  Visit Complete: In person  Patient Medicare AWV questionnaire was completed by the patient on ; I have confirmed that all information answered by patient is correct and no changes since this date.        Objective:    Today's Vitals   11/11/23 0801  BP: 128/78  Pulse: (!) 58  Temp: (!) 97 F (36.1 C)  TempSrc: Oral  SpO2: 97%  Weight: 174 lb (78.9 kg)  Height: 5' 9 (1.753 m)   Body mass index is 25.7 kg/m.     07/10/2021   10:23 AM  Advanced Directives  Does Patient Have a Medical Advance Directive? No  Would patient like information on creating a medical advance directive? No - Patient declined    Current Medications (verified) Outpatient Encounter Medications as of 11/11/2023  Medication Sig   amLODipine  (NORVASC ) 5 MG tablet Take 1 tablet (5 mg total) by mouth daily.   atorvastatin  (LIPITOR) 80 MG tablet Take 1 tablet (80 mg total) by mouth daily.   ezetimibe  (ZETIA ) 10 MG tablet Take 1 tablet (10 mg total) by mouth daily.   lisinopril  (ZESTRIL ) 40 MG tablet Take 1 tablet (40 mg total) by mouth daily.   [DISCONTINUED] hydrochlorothiazide (HYDRODIURIL) 25 MG tablet Take 25 mg by mouth.   No facility-administered encounter medications on file as of 11/11/2023.    Allergies (verified) Patient has no known allergies.   History: Past Medical History:  Diagnosis Date   Hardening of the aorta (main artery of the heart) (HCC) 01/16/2023   07/03/2022 TTE under Cardiology     High cholesterol    Hypertension    Pre-diabetes    Vitamin D deficiency 07/10/2021   History reviewed. No pertinent surgical history. Family History  Problem Relation Age of Onset   Hypertension Brother    Hyperlipidemia Brother    Diabetes Brother    Social History   Socioeconomic History   Marital status: Married    Spouse name: Not on file   Number of children:  Not on file   Years of education: Not on file   Highest education level: Not on file  Occupational History   Not on file  Tobacco Use   Smoking status: Some Days    Types: Cigars   Smokeless tobacco: Never  Vaping Use   Vaping status: Never Used  Substance and Sexual Activity   Alcohol use: Yes    Comment: social drinker   Drug use: Never   Sexual activity: Yes  Other Topics Concern   Not on file  Social History Narrative   Not on file   Social Drivers of Health   Financial Resource Strain: Low Risk  (04/09/2023)   Overall Financial Resource Strain (CARDIA)    Difficulty of Paying Living Expenses: Not very hard  Food Insecurity: Food Insecurity Present (04/09/2023)   Hunger Vital Sign    Worried About Running Out of Food in the Last Year: Never true    Ran Out of Food in the Last Year: Sometimes true  Transportation Needs: Unmet Transportation Needs (04/09/2023)   PRAPARE - Transportation    Lack of Transportation (Medical): Yes    Lack of Transportation (Non-Medical): Yes  Physical Activity: Sufficiently Active (04/09/2023)   Exercise Vital Sign    Days of Exercise per Week: 5 days    Minutes of Exercise per Session: 30 min  Stress: No Stress  Concern Present (04/09/2023)   [employer] of Occupational Health - Occupational Stress Questionnaire    Feeling of Stress : Not at all  Social Connections: Moderately Isolated (04/09/2023)   Social Connection and Isolation Panel    Frequency of Communication with Friends and Family: Once a week    Frequency of Social Gatherings with Friends and Family: Never    Attends Religious Services: More than 4 times per year    Active Member of [employer] or Organizations: No    Attends Banker Meetings: Never    Marital Status: Married    Tobacco Counseling Ready to quit: Not Answered Counseling given: Not Answered   Clinical Intake:                        Activities of Daily Living    11/11/2023    8:05 AM   In your present state of health, do you have any difficulty performing the following activities:  Hearing? 0  Vision? 0  Difficulty concentrating or making decisions? 0  Walking or climbing stairs? 0  Dressing or bathing? 0  Doing errands, shopping? 0    Patient Care Team: Herold Hadassah SQUIBB, MD as PCP - General (Family Medicine)  Indicate any recent Medical Services you may have received from other than [facility] providers in the past year (date may be approximate).     Assessment:   This is a routine wellness examination for Scott Compton.  Hearing/Vision screen No results found.   Goals Addressed             This Visit's Progress    DIET - INCREASE WATER INTAKE         Depression Screen    11/11/2023    8:14 AM 11/11/2023    8:13 AM 04/09/2023    9:05 AM  PHQ 2/9 Scores  PHQ - 2 Score 0 0 0  PHQ- 9 Score 0  0    Fall Risk    11/11/2023    8:12 AM  Fall Risk   Falls in the past year? 0  Number falls in past yr: 0  Injury with Fall? 0  Risk for fall due to : No Fall Risks  Follow up Falls evaluation completed    MEDICARE RISK AT HOME: Medicare Risk at Home Any stairs in or around the home?: No If so, are there any without handrails?: No Home free of loose throw rugs in walkways, pet beds, electrical cords, etc?: Yes Adequate lighting in your home to reduce risk of falls?: Yes Life alert?: No Use of a cane, walker or w/c?: No Grab bars in the bathroom?: No Shower chair or bench in shower?: No Elevated toilet seat or a handicapped toilet?: No  TIMED UP AND GO:  Was the test performed?  Yes  Length of time to ambulate 10 feet: 8 sec Gait steady and fast without use of assistive device    Cognitive Function:        11/11/2023    8:06 AM  6CIT Screen  What Year? 0 points  What month? 0 points  What time? 0 points  Count back from 20 0 points  Months in reverse 0 points  Repeat phrase 0 points  Total Score 0 points    Immunizations  There is no  immunization history on file for this patient.  TDAP status: Up to date  Flu Vaccine status: Up to date  Pneumococcal vaccine status: Declined,  Education has  been provided regarding the importance of this vaccine but patient still declined. Advised may receive this vaccine at local pharmacy or Health Dept. Aware to provide a copy of the vaccination record if obtained from local pharmacy or Health Dept. Verbalized acceptance and understanding.   Covid-19 vaccine status: Declined, Education has been provided regarding the importance of this vaccine but patient still declined. Advised may receive this vaccine at local pharmacy or Health Dept.or vaccine clinic. Aware to provide a copy of the vaccination record if obtained from local pharmacy or Health Dept. Verbalized acceptance and understanding.  Qualifies for Shingles Vaccine? Yes   Zostavax completed No   Shingrix Completed?: No.    Education has been provided regarding the importance of this vaccine. Patient has been advised to call insurance company to determine out of pocket expense if they have not yet received this vaccine. Advised may also receive vaccine at local pharmacy or Health Dept. Verbalized acceptance and understanding.  Screening Tests Health Maintenance  Topic Date Due   Fecal DNA (Cologuard)  Never done   HEMOGLOBIN A1C  10/07/2023   COVID-19 Vaccine (1 - 2024-25 season) 11/27/2023 (Originally 11/30/2022)   INFLUENZA VACCINE  11/30/2023 (Originally 10/30/2023)   Zoster Vaccines- Shingrix (1 of 2) 02/11/2024 (Originally 03/07/2003)   DTaP/Tdap/Td (1 - Tdap) 04/08/2024 (Originally 03/06/1972)   Pneumococcal Vaccine: 50+ Years (1 of 2 - PCV) 04/08/2024 (Originally 03/06/1972)   OPHTHALMOLOGY EXAM  03/15/2024   Diabetic kidney evaluation - eGFR measurement  04/08/2024   Diabetic kidney evaluation - Urine ACR  04/08/2024   FOOT EXAM  11/10/2024   Medicare Annual Wellness (AWV)  11/10/2024   Hepatitis C Screening  Completed    Hepatitis B Vaccines  Aged Out   HPV VACCINES  Aged Out   Meningococcal B Vaccine  Aged Out    Health Maintenance  Health Maintenance Due  Topic Date Due   Fecal DNA (Cologuard)  Never done   HEMOGLOBIN A1C  10/07/2023       Additional Screening:   Vision Screening: Recommended annual ophthalmology exams for early detection of glaucoma and other disorders of the eye. Is the patient up to date with their annual eye exam?  Yes  Who is the provider or what is the name of the office in which the patient attends annual eye exams?  If pt is not established with a provider, would they like to be referred to a provider to establish care? No .   Dental Screening: Recommended annual dental exams for proper oral hygiene    Community Resource Referral / Chronic Care Management: CRR required this visit?  No   CCM required this visit?  No     Plan:     I have personally reviewed and noted the following in the patient's chart:   Medical and social history Use of alcohol, tobacco or illicit drugs  Current medications and supplements including opioid prescriptions. Patient is not currently taking opioid prescriptions. Functional ability and status Nutritional status Physical activity Advanced directives List of other physicians Hospitalizations, surgeries, and ER visits in previous 12 months Vitals Screenings to include cognitive, depression, and falls Referrals and appointments Refills already in place  In addition, I have reviewed and discussed with patient certain preventive protocols, quality metrics, and best practice recommendations. A written personalized care plan for preventive services as well as general preventive health recommendations were provided to patient.     Hadassah SHAUNNA Nett, MD   11/11/2023   After Visit Summary: (In  Person-Printed) AVS printed and given to the patient

## 2023-11-11 NOTE — Patient Instructions (Signed)
  Scott Compton , Thank you for taking time to come for your Medicare Wellness Visit. I appreciate your ongoing commitment to your health goals. Please review the following plan we discussed and let me know if I can assist you in the future.   These are the goals we discussed:  Goals      DIET - INCREASE WATER INTAKE        This is a list of the screening recommended for you and due dates:  Health Maintenance  Topic Date Due   Complete foot exam   Never done   Colon Cancer Screening  Never done   Zoster (Shingles) Vaccine (1 of 2) Never done   COVID-19 Vaccine (1 - 2024-25 season) Never done   Hemoglobin A1C  10/07/2023   Flu Shot  10/30/2023   DTaP/Tdap/Td vaccine (1 - Tdap) 04/08/2024*   Pneumococcal Vaccine for age over 43 (1 of 2 - PCV) 04/08/2024*   Eye exam for diabetics  03/15/2024   Yearly kidney function blood test for diabetes  04/08/2024   Yearly kidney health urinalysis for diabetes  04/08/2024   Medicare Annual Wellness Visit  11/10/2024   Hepatitis C Screening  Completed   Hepatitis B Vaccine  Aged Out   HPV Vaccine  Aged Out   Meningitis B Vaccine  Aged Out  *Topic was postponed. The date shown is not the original due date.

## 2023-11-12 LAB — BASIC METABOLIC PANEL WITH GFR
BUN/Creatinine Ratio: 11 (ref 10–24)
BUN: 12 mg/dL (ref 8–27)
CO2: 24 mmol/L (ref 20–29)
Calcium: 9.6 mg/dL (ref 8.6–10.2)
Chloride: 99 mmol/L (ref 96–106)
Creatinine, Ser: 1.05 mg/dL (ref 0.76–1.27)
Glucose: 106 mg/dL — ABNORMAL HIGH (ref 70–99)
Potassium: 4.5 mmol/L (ref 3.5–5.2)
Sodium: 139 mmol/L (ref 134–144)
eGFR: 76 mL/min/1.73 (ref >59)

## 2023-11-12 LAB — HEMOGLOBIN A1C
Est. average glucose Bld gHb Est-mCnc: 128 mg/dL
Hgb A1c MFr Bld: 6.1 % — ABNORMAL HIGH (ref 4.8–5.6)

## 2023-11-19 ENCOUNTER — Ambulatory Visit: Payer: Self-pay | Admitting: Pediatrics

## 2024-01-13 ENCOUNTER — Encounter: Payer: Self-pay | Admitting: Pediatrics

## 2024-01-13 ENCOUNTER — Ambulatory Visit (INDEPENDENT_AMBULATORY_CARE_PROVIDER_SITE_OTHER): Admitting: Pediatrics

## 2024-01-13 VITALS — BP 116/65 | HR 60 | Temp 97.6°F | Wt 175.0 lb

## 2024-01-13 DIAGNOSIS — I10 Essential (primary) hypertension: Secondary | ICD-10-CM

## 2024-01-13 DIAGNOSIS — R600 Localized edema: Secondary | ICD-10-CM

## 2024-01-13 NOTE — Progress Notes (Signed)
 Office Visit  BP 116/65   Pulse 60   Temp 97.6 F (36.4 C) (Oral)   Wt 175 lb (79.4 kg)   SpO2 98%   BMI 25.84 kg/m    Subjective:    Patient ID: Scott Compton, male    DOB: [birth year], 73 y.o.   MRN: [id number]  HPI: Scott Compton is a 73 y.o. male  Chief Complaint  Patient presents with   Facial Swelling    Going on for about a month , stays for a couple of days then goes away    Due to language barrier, certified spanish speaking provider was present during the history-taking, physical exam and subsequent discussion with this patient.  Discussed the use of AI scribe software for clinical note transcription with the patient, who gave verbal consent to proceed.  History of Present Illness   Scott Compton is a 73 year old male with hypertension who presents with recurrent facial swelling.  He has been experiencing recurrent swelling of the lips and sometimes one side of the face, which began approximately one month ago. The swelling can affect either side of the face or both sides simultaneously. Initially, he attributed the swelling to a dental procedure, but his dentist ruled out this possibility. The dentist suggested it might be an allergic reaction and prescribed a medication that temporarily resolved the swelling, but it recurred after three days.  The episodes of swelling occur unpredictably, sometimes happening one week and not the next. The most recent episode occurred a few days ago, with significant swelling noted. No associated pain, respiratory difficulties, or nausea during these episodes. He has not taken any medications like Benadryl to manage the swelling and typically waits for it to resolve on its own.  He is currently taking lisinopril  for hypertension, which he has been on for some time. He reports that he has not experienced swelling as a side effect of lisinopril  before. He does not monitor his blood pressure at home but reports feeling well without  symptoms of high or low blood pressure.        Relevant past medical, surgical, family and social history reviewed and updated as indicated. Interim medical history since our last visit reviewed. Allergies and medications reviewed and updated.  ROS per HPI unless specifically indicated above     Objective:    BP 116/65   Pulse 60   Temp 97.6 F (36.4 C) (Oral)   Wt 175 lb (79.4 kg)   SpO2 98%   BMI 25.84 kg/m   Wt Readings from Last 3 Encounters:  01/13/24 175 lb (79.4 kg)  11/11/23 174 lb (78.9 kg)  04/09/23 181 lb 12.8 oz (82.5 kg)     Physical Exam Constitutional:      Appearance: Normal appearance.     Comments: No facial swelling  HENT:     Head: Normocephalic.     Jaw: No tenderness or swelling.     Salivary Glands: Right salivary gland is not diffusely enlarged or tender. Left salivary gland is not diffusely enlarged or tender.     Nose:     Right Sinus: No maxillary sinus tenderness or frontal sinus tenderness.     Left Sinus: No maxillary sinus tenderness or frontal sinus tenderness.     Mouth/Throat:     Mouth: No angioedema.  Pulmonary:     Effort: Pulmonary effort is normal.  Musculoskeletal:        General: Normal range of motion.  Skin:  Comments: Normal skin color  Neurological:     General: No focal deficit present.     Mental Status: He is alert. Mental status is at baseline.  Psychiatric:        Mood and Affect: Mood normal.        Behavior: Behavior normal.        Thought Content: Thought content normal.         01/13/2024    9:07 AM 11/11/2023    8:14 AM 11/11/2023    8:13 AM 04/09/2023    9:05 AM  Depression screen PHQ 2/9  Decreased Interest 0 0 0 0  Down, Depressed, Hopeless 0 0 0 0  PHQ - 2 Score 0 0 0 0  Altered sleeping 0 0  0  Tired, decreased energy 0 0  0  Change in appetite 0 0  0  Feeling bad or failure about yourself  0 0  0  Trouble concentrating 0 0  0  Moving slowly or fidgety/restless 0 0  0  Suicidal thoughts 0  0  0  PHQ-9 Score 0 0  0  Difficult doing work/chores Not difficult at all Not difficult at all  Not difficult at all       01/13/2024    9:07 AM 11/11/2023    8:15 AM 04/09/2023    9:06 AM  GAD 7 : Generalized Anxiety Score  Nervous, Anxious, on Edge 0 0 0  Control/stop worrying 0 0 0  Worry too much - different things 0 0 0  Trouble relaxing 0 0 0  Restless 0 0 0  Easily annoyed or irritable 0 0 0  Afraid - awful might happen 0 0 0  Total GAD 7 Score 0 0 0  Anxiety Difficulty Not difficult at all Not difficult at all Not difficult at all       Assessment & Plan:  Assessment & Plan   Primary hypertension Assessment & Plan: Currently on lisinopril  and amlodipine . Discontinue lisinopril  due to suspected angioedema. Reassess blood pressure control post-discontinuation. - Monitor blood pressure at home. - Increase amlodipine  if blood pressure uncontrolled post-lisinopril . - Instructed to call clinic if experiencing headaches or feeling unwell.   Facial edema Intermittent lip and facial swelling likely angioedema from lisinopril . Resolved with antihistamine but recurs. Differential includes medication-induced angioedema and allergic reaction. - Discontinue lisinopril , monitor for symptom resolution over 3-4 weeks. - Instructed to call and visit clinic immediately if swelling recurs. - Consider allergist referral if symptoms persist post-lisinopril .    Follow up plan: Return in about 4 weeks (around 02/10/2024) for HTN.  Scott SHAUNNA Nett, MD

## 2024-01-13 NOTE — Assessment & Plan Note (Signed)
 Currently on lisinopril  and amlodipine . Discontinue lisinopril  due to suspected angioedema. Reassess blood pressure control post-discontinuation. - Monitor blood pressure at home. - Increase amlodipine  if blood pressure uncontrolled post-lisinopril . - Instructed to call clinic if experiencing headaches or feeling unwell.

## 2024-02-04 ENCOUNTER — Telehealth: Payer: Self-pay | Admitting: Pediatrics

## 2024-02-04 NOTE — Telephone Encounter (Signed)
 Ok for E2C2 to review.  Please advise below message/information from provider.

## 2024-02-04 NOTE — Telephone Encounter (Signed)
 Patient states he was taken off Lisinopril  But is still experiencing swelling in his lips & eye. Requesting a phone call from provider. Please advise. [phone].

## 2024-02-10 ENCOUNTER — Encounter: Payer: Self-pay | Admitting: Pediatrics

## 2024-02-10 ENCOUNTER — Ambulatory Visit: Admitting: Pediatrics

## 2024-02-10 VITALS — BP 141/80 | HR 60 | Temp 97.8°F | Ht 69.0 in | Wt 176.2 lb

## 2024-02-10 DIAGNOSIS — R600 Localized edema: Secondary | ICD-10-CM

## 2024-02-10 DIAGNOSIS — I10 Essential (primary) hypertension: Secondary | ICD-10-CM | POA: Diagnosis not present

## 2024-02-10 MED ORDER — METHYLPREDNISOLONE 4 MG PO TBPK: ORAL_TABLET | ORAL | 0 refills | Status: DC

## 2024-02-10 MED ORDER — AMLODIPINE BESYLATE 10 MG PO TABS: 10.0000 mg | ORAL_TABLET | Freq: Every day | ORAL | 3 refills | Status: AC

## 2024-02-10 NOTE — Patient Instructions (Signed)
 Subir el amlodipine  a 10mg   Mande medrol por si se incha otra vez

## 2024-02-10 NOTE — Progress Notes (Signed)
 Office Visit  BP (!) 141/80   Pulse 60   Temp 97.8 F (36.6 C) (Oral)   Ht 5' 9 (1.753 m)   Wt 176 lb 4 oz (79.9 kg)   SpO2 99%   BMI 26.03 kg/m    Subjective:    Patient ID: Scott Compton, male    DOB: [birth year], 73 y.o.   MRN: [id number]  HPI: Scott Compton is a 73 y.o. male  Chief Complaint  Patient presents with   Hypertension   Facial Swelling    Discussed the use of AI scribe software for clinical note transcription with the patient, who gave verbal consent to proceed.  History of Present Illness   Scott Compton is a 73 year old male who presents with recurrent facial swelling.  He experiences recurrent episodes of facial swelling, particularly in the cheeks and above the eyes. The swelling is intermittent, described as coming and going, and he has documented these episodes with photographs. No associated symptoms such as pain, infection, or dietary changes have been noted. He has not identified any new exposures or environmental changes that could explain the swelling.  He is currently taking lisinopril , but it is unclear if this medication is related to his symptoms.     Relevant past medical, surgical, family and social history reviewed and updated as indicated. Interim medical history since our last visit reviewed. Allergies and medications reviewed and updated.  ROS per HPI unless specifically indicated above     Objective:    BP (!) 141/80   Pulse 60   Temp 97.8 F (36.6 C) (Oral)   Ht 5' 9 (1.753 m)   Wt 176 lb 4 oz (79.9 kg)   SpO2 99%   BMI 26.03 kg/m   Wt Readings from Last 3 Encounters:  02/10/24 176 lb 4 oz (79.9 kg)  01/13/24 175 lb (79.4 kg)  11/11/23 174 lb (78.9 kg)     Physical Exam Constitutional:      Appearance: Normal appearance.  Pulmonary:     Effort: Pulmonary effort is normal.  Musculoskeletal:        General: Normal range of motion.  Skin:    Comments: Normal skin color  Neurological:     General: No focal  deficit present.     Mental Status: He is alert. Mental status is at baseline.  Psychiatric:        Mood and Affect: Mood normal.        Behavior: Behavior normal.        Thought Content: Thought content normal.         02/10/2024    8:07 AM 01/13/2024    9:07 AM 11/11/2023    8:14 AM 11/11/2023    8:13 AM 04/09/2023    9:05 AM  Depression screen PHQ 2/9  Decreased Interest 0 0 0 0 0  Down, Depressed, Hopeless 0 0 0 0 0  PHQ - 2 Score 0 0 0 0 0  Altered sleeping 0 0 0  0  Tired, decreased energy 0 0 0  0  Change in appetite 0 0 0  0  Feeling bad or failure about yourself  0 0 0  0  Trouble concentrating 0 0 0  0  Moving slowly or fidgety/restless 0 0 0  0  Suicidal thoughts 0 0 0  0  PHQ-9 Score 0 0  0   0   Difficult doing work/chores Not difficult at all Not difficult at all Not difficult at  all  Not difficult at all     Data saved with a previous flowsheet row definition       02/10/2024    8:07 AM 01/13/2024    9:07 AM 11/11/2023    8:15 AM 04/09/2023    9:06 AM  GAD 7 : Generalized Anxiety Score  Nervous, Anxious, on Edge 0 0 0 0  Control/stop worrying 0 0 0 0  Worry too much - different things 0 0 0 0  Trouble relaxing 0 0 0 0  Restless 0 0 0 0  Easily annoyed or irritable 0 0 0 0  Afraid - awful might happen 0 0 0 0  Total GAD 7 Score 0 0 0 0  Anxiety Difficulty Not difficult at all Not difficult at all Not difficult at all Not difficult at all       Assessment & Plan:  Assessment & Plan   Primary hypertension Blood pressure slightly elevated. Current regimen includes amlodipine . - Increased amlodipine  to 10 mg daily. - Checked kidney function for dehydration-related issues. - Scheduled follow-up in one month to reassess blood pressure.  -     amLODIPine  Besylate; Take 1 tablet (10 mg total) by mouth daily.  Dispense: 90 tablet; Refill: 3 -     Basic metabolic panel with GFR  Facial edema Intermittent facial swelling without pain or infection. Suspected  allergy, possibly related to lisinopril , but symptoms persist post-medication change. - Referred to allergist for comprehensive allergy testing. - Prescribed steroid for use during episodes of swelling. -     Ambulatory referral to Allergy -     methylPREDNISolone; Follow package instruction. Use if facial swelling recurs.  Dispense: 21 each; Refill: 0   Follow up plan: Return in about 4 weeks (around 03/09/2024) for HTN.  Hadassah SHAUNNA Nett, MD

## 2024-02-11 ENCOUNTER — Ambulatory Visit: Payer: Self-pay | Admitting: Pediatrics

## 2024-02-11 LAB — BASIC METABOLIC PANEL WITH GFR
BUN/Creatinine Ratio: 14 (ref 10–24)
BUN: 13 mg/dL (ref 8–27)
CO2: 26 mmol/L (ref 20–29)
Calcium: 9.1 mg/dL (ref 8.6–10.2)
Chloride: 99 mmol/L (ref 96–106)
Creatinine, Ser: 0.92 mg/dL (ref 0.76–1.27)
Glucose: 95 mg/dL (ref 70–99)
Potassium: 4 mmol/L (ref 3.5–5.2)
Sodium: 138 mmol/L (ref 134–144)
eGFR: 89 mL/min/1.73 (ref >59)

## 2024-02-16 ENCOUNTER — Encounter: Payer: Self-pay | Admitting: Pediatrics

## 2024-03-09 ENCOUNTER — Encounter: Payer: Self-pay | Admitting: Pediatrics

## 2024-03-09 ENCOUNTER — Ambulatory Visit (INDEPENDENT_AMBULATORY_CARE_PROVIDER_SITE_OTHER): Admitting: Pediatrics

## 2024-03-09 VITALS — BP 136/78 | HR 63 | Temp 98.0°F | Ht 69.0 in | Wt 177.6 lb

## 2024-03-09 DIAGNOSIS — I10 Essential (primary) hypertension: Secondary | ICD-10-CM

## 2024-03-09 DIAGNOSIS — R600 Localized edema: Secondary | ICD-10-CM | POA: Insufficient documentation

## 2024-03-09 MED ORDER — METHYLPREDNISOLONE 4 MG PO TBPK: ORAL_TABLET | ORAL | 1 refills | Status: AC

## 2024-03-09 MED ORDER — HYDROCHLOROTHIAZIDE 25 MG PO TABS: 25.0000 mg | ORAL_TABLET | Freq: Every morning | ORAL | 3 refills | Status: AC

## 2024-03-09 NOTE — Progress Notes (Signed)
 Office Visit  BP 136/78   Pulse 63   Temp 98 F (36.7 C) (Oral)   Ht 5' 9 (1.753 m)   Wt 177 lb 9.6 oz (80.6 kg)   SpO2 98%   BMI 26.23 kg/m    Subjective:    Patient ID: Scott Compton, male    DOB: 1950-08-12, 73 y.o.   MRN: [id number]  HPI: Scott Compton is a 73 y.o. male  Chief Complaint  Patient presents with   Hypertension    Discussed the use of AI scribe software for clinical note transcription with the patient, who gave verbal consent to proceed.  History of Present Illness   Scott Compton is a 74 year old male who presents with facial swelling.  He experiences intermittent facial swelling, which occurs less frequently than before but still happens occasionally. The swelling lasts for a couple of hours before subsiding. He manages the swelling by using cold compresses and taking Benadryl as needed. He has not taken the prescribed Medrol  but has kept it at the pharmacy in case the condition worsens.  He is currently on medication for hypertension, which is working well.  His daughter and grandchildren will be visiting from [place]  for Christmas. He previously lived in [place]  and moved about a year and a half ago.     Relevant past medical, surgical, family and social history reviewed and updated as indicated. Interim medical history since our last visit reviewed. Allergies and medications reviewed and updated.  ROS per HPI unless specifically indicated above     Objective:    BP 136/78   Pulse 63   Temp 98 F (36.7 C) (Oral)   Ht 5' 9 (1.753 m)   Wt 177 lb 9.6 oz (80.6 kg)   SpO2 98%   BMI 26.23 kg/m   Wt Readings from Last 3 Encounters:  03/09/24 177 lb 9.6 oz (80.6 kg)  02/10/24 176 lb 4 oz (79.9 kg)  01/13/24 175 lb (79.4 kg)     Physical Exam Constitutional:      Appearance: Normal appearance.  Cardiovascular:     Rate and Rhythm: Normal rate and regular rhythm.     Pulses: Normal pulses.     Heart sounds: Normal heart  sounds.  Pulmonary:     Effort: Pulmonary effort is normal.     Breath sounds: Normal breath sounds.  Musculoskeletal:        General: Normal range of motion.  Skin:    Comments: Normal skin color  Neurological:     General: No focal deficit present.     Mental Status: He is alert. Mental status is at baseline.  Psychiatric:        Mood and Affect: Mood normal.        Behavior: Behavior normal.        Thought Content: Thought content normal.         03/09/2024    8:23 AM 02/10/2024    8:07 AM 01/13/2024    9:07 AM 11/11/2023    8:14 AM 11/11/2023    8:13 AM  Depression screen PHQ 2/9  Decreased Interest 0 0 0 0 0  Down, Depressed, Hopeless 0 0 0 0 0  PHQ - 2 Score 0 0 0 0 0  Altered sleeping 0 0 0 0   Tired, decreased energy 0 0 0 0   Change in appetite 0 0 0 0   Feeling bad or failure about yourself  0 0 0 0  Trouble concentrating 0 0 0 0   Moving slowly or fidgety/restless 0 0 0 0   Suicidal thoughts 0 0 0 0   PHQ-9 Score 0 0 0  0    Difficult doing work/chores Not difficult at all Not difficult at all Not difficult at all Not difficult at all      Data saved with a previous flowsheet row definition       03/09/2024    8:23 AM 02/10/2024    8:07 AM 01/13/2024    9:07 AM 11/11/2023    8:15 AM  GAD 7 : Generalized Anxiety Score  Nervous, Anxious, on Edge 0 0 0 0  Control/stop worrying 0 0 0 0  Worry too much - different things 0 0 0 0  Trouble relaxing 0 0 0 0  Restless 0 0 0 0  Easily annoyed or irritable 0 0 0 0  Afraid - awful might happen 0 0 0 0  Total GAD 7 Score 0 0 0 0  Anxiety Difficulty Not difficult at all Not difficult at all Not difficult at all Not difficult at all       Assessment & Plan:  Assessment & Plan   Primary hypertension Assessment & Plan: Blood pressure well-controlled with current medications amlodipine  10mg  and hydrochlorothiazide 25mg . - Continue current antihypertensive medications with refills for one year.   Orders: -      hydroCHLOROthiazide; Take 1 tablet (25 mg total) by mouth every morning.  Dispense: 90 tablet; Refill: 3  Facial edema Assessment & Plan: Intermittent facial swelling persists, less frequent and severe. Medrol  prescribed but not taken. Awaiting allergist appointment. - Keep Medrol  at pharmacy for potential use if symptoms worsen. - Use Benadryl or cold compresses as needed for symptom relief.  Orders: -     methylPREDNISolone ; Follow packaging instructions. To take if swelling occurs. Do not exceed more than 1 refill.  Dispense: 21 each; Refill: 1     Follow up plan: Return in about 6 months (around 09/07/2024) for DM.  Hadassah SHAUNNA Nett, MD

## 2024-03-09 NOTE — Assessment & Plan Note (Signed)
 Blood pressure well-controlled with current medications amlodipine  10mg  and hydrochlorothiazide 25mg . - Continue current antihypertensive medications with refills for one year.

## 2024-03-09 NOTE — Assessment & Plan Note (Signed)
 Intermittent facial swelling persists, less frequent and severe. Medrol  prescribed but not taken. Awaiting allergist appointment. - Keep Medrol  at pharmacy for potential use if symptoms worsen. - Use Benadryl or cold compresses as needed for symptom relief.

## 2024-05-17 ENCOUNTER — Ambulatory Visit: Admitting: Pediatrics

## 2024-09-07 ENCOUNTER — Ambulatory Visit: Admitting: Nurse Practitioner
